# Patient Record
Sex: Female | Born: 1975 | Race: Black or African American | Hispanic: No | Marital: Married | Smoking: Never smoker
Health system: Southern US, Community
[De-identification: ages and names within clinical notes are randomized; demographics above are authoritative.]

## PROBLEM LIST (undated history)

## (undated) DIAGNOSIS — R7303 Prediabetes: Secondary | ICD-10-CM

## (undated) DIAGNOSIS — E079 Disorder of thyroid, unspecified: Secondary | ICD-10-CM

## (undated) DIAGNOSIS — D649 Anemia, unspecified: Secondary | ICD-10-CM

## (undated) DIAGNOSIS — J302 Other seasonal allergic rhinitis: Secondary | ICD-10-CM

## (undated) DIAGNOSIS — K219 Gastro-esophageal reflux disease without esophagitis: Secondary | ICD-10-CM

---

## 2003-08-25 ENCOUNTER — Other Ambulatory Visit: Payer: Self-pay

## 2003-09-27 ENCOUNTER — Other Ambulatory Visit: Payer: Self-pay

## 2003-11-14 ENCOUNTER — Other Ambulatory Visit: Payer: Self-pay

## 2011-04-15 DIAGNOSIS — Z0000 Encounter for general adult medical examination without abnormal findings: Secondary | ICD-10-CM | POA: Insufficient documentation

## 2013-04-20 DIAGNOSIS — D509 Iron deficiency anemia, unspecified: Secondary | ICD-10-CM | POA: Insufficient documentation

## 2013-04-20 DIAGNOSIS — E038 Other specified hypothyroidism: Secondary | ICD-10-CM | POA: Insufficient documentation

## 2013-06-22 ENCOUNTER — Ambulatory Visit: Payer: Self-pay | Admitting: Physician Assistant

## 2013-06-25 LAB — BETA STREP CULTURE(ARMC)

## 2013-06-26 DIAGNOSIS — R490 Dysphonia: Secondary | ICD-10-CM | POA: Insufficient documentation

## 2013-10-29 DIAGNOSIS — L608 Other nail disorders: Secondary | ICD-10-CM | POA: Insufficient documentation

## 2013-10-29 DIAGNOSIS — E010 Iodine-deficiency related diffuse (endemic) goiter: Secondary | ICD-10-CM | POA: Insufficient documentation

## 2017-11-16 DIAGNOSIS — L301 Dyshidrosis [pompholyx]: Secondary | ICD-10-CM | POA: Insufficient documentation

## 2017-11-16 DIAGNOSIS — R739 Hyperglycemia, unspecified: Secondary | ICD-10-CM | POA: Insufficient documentation

## 2017-11-16 DIAGNOSIS — E7800 Pure hypercholesterolemia, unspecified: Secondary | ICD-10-CM | POA: Insufficient documentation

## 2017-11-16 DIAGNOSIS — J301 Allergic rhinitis due to pollen: Secondary | ICD-10-CM | POA: Insufficient documentation

## 2018-02-28 ENCOUNTER — Other Ambulatory Visit: Payer: Self-pay

## 2018-02-28 ENCOUNTER — Ambulatory Visit
Admission: EM | Admit: 2018-02-28 | Discharge: 2018-02-28 | Disposition: A | Discharge status: Home / Self Care | Payer: Managed Care, Other (non HMO) | Attending: Internal Medicine | Admitting: Internal Medicine

## 2018-02-28 ENCOUNTER — Encounter: Payer: Self-pay | Admitting: Emergency Medicine

## 2018-02-28 DIAGNOSIS — M542 Cervicalgia: Secondary | ICD-10-CM | POA: Diagnosis not present

## 2018-02-28 DIAGNOSIS — R590 Localized enlarged lymph nodes: Secondary | ICD-10-CM | POA: Diagnosis not present

## 2018-02-28 DIAGNOSIS — H9201 Otalgia, right ear: Secondary | ICD-10-CM

## 2018-02-28 HISTORY — DX: Other seasonal allergic rhinitis: J30.2

## 2018-02-28 HISTORY — DX: Anemia, unspecified: D64.9

## 2018-02-28 HISTORY — DX: Gastro-esophageal reflux disease without esophagitis: K21.9

## 2018-02-28 HISTORY — DX: Disorder of thyroid, unspecified: E07.9

## 2018-02-28 HISTORY — DX: Prediabetes: R73.03

## 2018-02-28 MED ORDER — SULFAMETHOXAZOLE-TRIMETHOPRIM 800-160 MG PO TABS: 1.0000 | ORAL_TABLET | Freq: Two times a day (BID) | ORAL | 0 refills | Status: AC

## 2018-02-28 NOTE — ED Provider Notes (Signed)
[facility] ____________________________________________  Time seen: Approximately 3:51 PM  I have reviewed the triage vital signs and the nursing notes.   HISTORY  Chief Complaint Adenopathy   HPI Caitlin Hammond is a 42 y.o. female presenting for evaluation of right-sided intermittent ear pain, neck pain present for the last 3 days.  States today she occasionally had pain that would radiate from her neck up to her ear which was sharp, otherwise reports a dull aching pain that is not severe.  Denies any typical sore throat discomfort, painful swallowing, cough, congestion or fevers.  Has continued to eat and drink well.  Denies any oral swelling sensation.  Denies trauma.  Denies any insect sting or rash.  Denies dental tenderness.  Denies history of the same.  States that she feels like there is a lymph node that is swollen in her right neck and that is tender when palpated.  Has not been taking any medications over-the-counter on a regular basis for the same complaint.  Denies any other aggravating or alleviating factors.  Denies trigger factors.  Reports otherwise feels well. Denies recent sickness. Denies recent antibiotic use.   [place]: PCP Patient's last menstrual period was 02/23/2018.  Denies pregnancy   Past Medical History:  Diagnosis Date  . Anemia   . GERD (gastroesophageal reflux disease)   . Prediabetes   . Seasonal allergies   . Thyroid disease     There are no active problems to display for this patient.   History reviewed. No pertinent surgical history.   No current facility-administered medications for this encounter.   Current Outpatient Medications:  .  ferrous sulfate 325 (65 FE) MG tablet, Take 1 tablet by mouth daily., Disp: , Rfl:  .  fluticasone (FLONASE) 50 MCG/ACT nasal spray, Place 2 sprays into the nose daily., Disp: , Rfl:  .  levothyroxine (SYNTHROID, LEVOTHROID) 100 MCG tablet, Take 1 tablet by mouth daily.,  Disp: , Rfl:  .  omeprazole (PRILOSEC) 20 MG capsule, TAKE 1 CAPSULE(20 MG) BY MOUTH EVERY DAY, Disp: , Rfl:  .  sulfamethoxazole-trimethoprim (BACTRIM DS,SEPTRA DS) 800-160 MG tablet, Take 1 tablet by mouth 2 (two) times daily for 7 days., Disp: 14 tablet, Rfl: 0  Allergies Penicillins  Family History  Problem Relation Age of Onset  . Pancreatitis Mother 4331  . Diabetes Father   . Congestive Heart Failure Father   . Hypertension Father     Social History Social History   Tobacco Use  . Smoking status: Never Smoker  . Smokeless tobacco: Never Used  Substance Use Topics  . Alcohol use: Never    Frequency: Never  . Drug use: Never    Review of Systems Constitutional: No fever/chills ENT: No sore throat. Cardiovascular: Denies chest pain. Respiratory: Denies shortness of breath. Gastrointestinal: No abdominal pain.  No nausea, no vomiting. Musculoskeletal: Negative for back pain. Skin: Negative for rash.  ____________________________________________   PHYSICAL EXAM:  VITAL SIGNS: ED Triage Vitals [02/28/18 1429]  Enc Vitals Group     BP (!) 149/83     Pulse Rate 91     Resp 16     Temp 98 F (36.7 C)     Temp Source Oral     SpO2 100 %     Weight (!) 304 lb (137.9 kg)     Height 5\' 2"  (1.575 m)     Head Circumference      Peak Flow      Pain Score  6     Pain Loc      Pain Edu?      Excl. in GC?    Constitutional: Alert and oriented. Well appearing and in no acute distress. Eyes: Conjunctivae are normal.  Head: Atraumatic. No sinus tenderness to palpation. No swelling. No erythema.  No TMJ tenderness.  Ears: nontender, normal canals,no erythema, normal TMs bilaterally.  No mastoid tenderness bilaterally.  Nose:No nasal congestion  Mouth/Throat: Mucous membranes are moist. No pharyngeal erythema. No exudate.  3+ tonsillar swelling.  No oral lesions.  Dental fractures to right upper and lower premolars, no erythema, nontender, no gumline swelling. Neck: No  stridor.  No cervical spine tenderness to palpation.  No thyromegaly palpated.  No visible edema.  No erythema. Hematological/Lymphatic/Immunilogical: Right anterior mild cervical lymph node swelling submandibular, no other cervical lymphadenopathy palpated. Cardiovascular: Normal rate, regular rhythm. Grossly normal heart sounds.  Good peripheral circulation. Respiratory: Normal respiratory effort.  No retractions. No wheezes, rales or rhonchi. Good air movement.  Musculoskeletal: Ambulatory with steady gait.  Neurologic:  Normal speech and language. No gait instability. Skin:  Skin appears warm, dry and intact. No rash noted. Psychiatric: Mood and affect are normal. Speech and behavior are normal.  ___________________________________________   LABS (all labs ordered are listed, but only abnormal results are displayed)  Labs Reviewed - No data to display  PROCEDURES Procedures   INITIAL IMPRESSION / ASSESSMENT AND PLAN / ED COURSE  Pertinent labs & imaging results that were available during my care of the patient were reviewed by me and considered in my medical decision making (see chart for details).  Well-appearing patient.  No acute distress.  Presented for evaluation of right-sided tender lymphadenopathy.  Patient does have chronic baseline tonsillar swelling, patient denies any acute swelling or sore throat.  No other lymphadenopathy noted.  Discussed multiple differentials with patient including cellulitis, pharyngitis, dental infection, TMJ, viral illness, sialoadenitis, right otitis.  Exam well-appearing except for tender right cervical lymph node.  Patient anaphylactic allergic to penicillin.  Discussed supportive care alone versus initiation of antibiotic therapy and supportive care, patient requesting about therapy, will treat with oral Bactrim.  Declined strep evaluation.  Discussed very strict follow-up and return parameters, over-the-counter use of ibuprofen and close  monitoring.Discussed indication, risks and benefits of medications with patient.  Discussed follow up with Primary care physician this week. Discussed follow up and return parameters including no resolution or any worsening concerns. Patient verbalized understanding and agreed to plan.   ____________________________________________   FINAL CLINICAL IMPRESSION(S) / ED DIAGNOSES  Final diagnoses:  Lymphadenopathy of right cervical region     ED Discharge Orders         Ordered    sulfamethoxazole-trimethoprim (BACTRIM DS,SEPTRA DS) 800-160 MG tablet  2 times daily     02/28/18 1521           Note: This dictation was prepared with Dragon dictation along with smaller phrase technology. Any transcriptional errors that result from this process are unintentional.         Renford DillsMiller, Reyden Smith, NP 02/28/18 1637

## 2018-02-28 NOTE — ED Triage Notes (Signed)
Patient in today c/o right sided lymph node swelling and pain, right ear pain and right side of face pain x 3 days, worsening today. Patient denies fever.

## 2018-02-28 NOTE — Discharge Instructions (Addendum)
Take medication as prescribed. Rest. Drink plenty of fluids.  ° °Follow up with your primary care physician this week as needed. Return to Urgent care for new or worsening concerns.  ° °

## 2018-04-12 ENCOUNTER — Other Ambulatory Visit: Payer: Self-pay

## 2018-04-12 ENCOUNTER — Emergency Department
Admission: EM | Admit: 2018-04-12 | Discharge: 2018-04-12 | Disposition: A | Discharge status: Home / Self Care | Payer: 59 | Attending: Emergency Medicine | Admitting: Emergency Medicine

## 2018-04-12 ENCOUNTER — Emergency Department: Payer: 59

## 2018-04-12 ENCOUNTER — Encounter: Payer: Self-pay | Admitting: Emergency Medicine

## 2018-04-12 DIAGNOSIS — R0602 Shortness of breath: Secondary | ICD-10-CM

## 2018-04-12 LAB — BASIC METABOLIC PANEL
ANION GAP: 7 (ref 5–15)
BUN: 11 mg/dL (ref 6–20)
CO2: 23 mmol/L (ref 22–32)
Calcium: 9.1 mg/dL (ref 8.9–10.3)
Chloride: 106 mmol/L (ref 98–111)
Creatinine, Ser: 0.63 mg/dL (ref 0.44–1.00)
GFR calc Af Amer: >60 mL/min (ref >60)
GLUCOSE: 100 mg/dL — AB (ref 70–99)
POTASSIUM: 3.9 mmol/L (ref 3.5–5.1)
SODIUM: 136 mmol/L (ref 135–145)

## 2018-04-12 LAB — CBC WITH DIFFERENTIAL/PLATELET
BASOS ABS: 0 10*3/uL (ref 0–0.1)
Basophils Relative: 1 %
Eosinophils Absolute: 0.2 10*3/uL (ref 0–0.7)
Eosinophils Relative: 3 %
HEMATOCRIT: 33.8 % — AB (ref 35.0–47.0)
Hemoglobin: 11.6 g/dL — ABNORMAL LOW (ref 12.0–16.0)
LYMPHS PCT: 25 %
Lymphs Abs: 1.7 10*3/uL (ref 1.0–3.6)
MCH: 28 pg (ref 26.0–34.0)
MCHC: 34.3 g/dL (ref 32.0–36.0)
MCV: 81.9 fL (ref 80.0–100.0)
Monocytes Absolute: 0.5 10*3/uL (ref 0.2–0.9)
Monocytes Relative: 8 %
NEUTROS ABS: 4.3 10*3/uL (ref 1.4–6.5)
Neutrophils Relative %: 63 %
Platelets: 486 10*3/uL — ABNORMAL HIGH (ref 150–440)
RBC: 4.12 MIL/uL (ref 3.80–5.20)
RDW: 16.1 % — ABNORMAL HIGH (ref 11.5–14.5)
WBC: 6.7 10*3/uL (ref 3.6–11.0)

## 2018-04-12 LAB — TROPONIN I: Troponin I: <0.03 ng/mL (ref <0.03)

## 2018-04-12 LAB — FIBRIN DERIVATIVES D-DIMER (ARMC ONLY): Fibrin derivatives D-dimer (ARMC): 294.13 ng/mL (FEU) (ref 0.00–499.00)

## 2018-04-12 NOTE — ED Notes (Signed)
Report to Stephen RN 

## 2018-04-12 NOTE — Discharge Instructions (Addendum)
Your test today, including labs, EKG, and chest x-ray are all unremarkable.  There are no signs of heart or lung disease at this time to explain your symptoms.  Please follow-up with your doctor for continued evaluation.  Return to the ED if your symptoms worsen or if you have any new concerns.

## 2018-04-12 NOTE — ED Triage Notes (Addendum)
Pt arrived via EMS from home with reports of shortness of breath that started last night, pt reports the SOB improved and then as she was getting ready this morning the shortness of breath started again.  Pt is able to speak in complete sentences without running out of breath but states she tires out when exerting herself.  Pt denies any pain. Pt has hx of IDA but is not taking iron supplements.

## 2018-04-12 NOTE — ED Provider Notes (Signed)
[facility] Emergency Department Provider Note  ____________________________________________  Time seen: Approximately 12:44 PM  I have reviewed the triage vital signs and the nursing notes.   HISTORY  Chief Complaint Shortness of Breath    HPI Caitlin Hammond is a 42 y.o. female with a history of iron deficiency anemia, GERD, seasonal allergies who complains of shortness of breath since last night.  No aggravating or alleviating factors.  Not exertional.  No chest pain whatsoever, not pleuritic.  No cough.  Not positional.  Denies fever chills or sweats.  No weight changes.  Denies ever having anything like this before.  It is intermittent lasting a few minutes at a time but today seems to be more continuous.  She denies nasal congestion or sinus pressure.  She does have a history of seasonal allergies though.        Past Medical History:  Diagnosis Date  . Anemia   . GERD (gastroesophageal reflux disease)   . Prediabetes   . Seasonal allergies   . Thyroid disease      There are no active problems to display for this patient.    History reviewed. No pertinent surgical history.   Prior to Admission medications   Medication Sig Start Date End Date Taking? Authorizing Provider  omeprazole (PRILOSEC) 20 MG capsule Take 20 mg by mouth daily as needed (for acid reflux).    Yes [provider]     Allergies Penicillins   Family History  Problem Relation Age of Onset  . Pancreatitis Mother 16  . Diabetes Father   . Congestive Heart Failure Father   . Hypertension Father     Social History Social History   Tobacco Use  . Smoking status: Never Smoker  . Smokeless tobacco: Never Used  Substance Use Topics  . Alcohol use: Never    Frequency: Never  . Drug use: Never    Review of Systems  Constitutional:   No fever or chills.  ENT:   No sore throat. No rhinorrhea. Cardiovascular:   No chest pain or syncope. Respiratory:  Positive as above for shortness of breath without cough. Gastrointestinal:   Negative for abdominal pain, vomiting and diarrhea.  Musculoskeletal:   Negative for focal pain or swelling All other systems reviewed and are negative except as documented above in ROS and HPI.  ____________________________________________   PHYSICAL EXAM:  VITAL SIGNS: ED Triage Vitals  Enc Vitals Group     BP 04/12/18 0749 (!) 144/72     Pulse Rate 04/12/18 0749 91     Resp 04/12/18 0749 20     Temp 04/12/18 0749 97.8 F (36.6 C)     Temp Source 04/12/18 0749 Oral     SpO2 04/12/18 0745 100 %     Weight 04/12/18 0747 (!) 304 lb 0.2 oz (137.9 kg)     Height 04/12/18 0747 5\' 2"  (1.575 m)     Head Circumference --      Peak Flow --      Pain Score 04/12/18 0749 0     Pain Loc --      Pain Edu? --      Excl. in GC? --     Vital signs reviewed, nursing assessments reviewed.   Constitutional:   Alert and oriented. Non-toxic appearance. Eyes:   Conjunctivae are normal. EOMI. PERRL. ENT      Head:   Normocephalic and atraumatic.      Nose:   No congestion/rhinnorhea.  Mouth/Throat:   MMM, no pharyngeal erythema. No peritonsillar mass.       Neck:   No meningismus. Full ROM. Hematological/Lymphatic/Immunilogical:   No cervical lymphadenopathy. Cardiovascular:   RRR. Symmetric bilateral radial and DP pulses.  No murmurs. Cap refill less than 2 seconds. Respiratory:   Normal respiratory effort without tachypnea/retractions. Breath sounds are clear and equal bilaterally. No wheezes/rales/rhonchi.  No inducible wheezing or cough with FEV1 maneuver. Gastrointestinal:   Soft and nontender. Non distended. There is no CVA tenderness.  No rebound, rigidity, or guarding. Genitourinary:   deferred Musculoskeletal:   Normal range of motion in all extremities. No joint effusions.  No lower extremity tenderness.  No edema. Neurologic:   Normal speech and language.  Motor grossly intact. No acute focal  neurologic deficits are appreciated.  Skin:    Skin is warm, dry and intact. No rash noted.  No petechiae, purpura, or bullae.  ____________________________________________    LABS (pertinent positives/negatives) (all labs ordered are listed, but only abnormal results are displayed) Labs Reviewed  BASIC METABOLIC PANEL - Abnormal; Notable for the following components:      Result Value   Glucose, Bld 100 (*)    All other components within normal limits  CBC WITH DIFFERENTIAL/PLATELET - Abnormal; Notable for the following components:   Hemoglobin 11.6 (*)    HCT 33.8 (*)    RDW 16.1 (*)    Platelets 486 (*)    All other components within normal limits  TROPONIN I  FIBRIN DERIVATIVES D-DIMER ([facility] ONLY)   ____________________________________________   EKG  Interpreted by me Sinus rhythm rate of 80, normal axis intervals QRS ST segments and T waves.  ____________________________________________    RADIOLOGY  Dg Chest 2 View  Result Date: 04/12/2018 CLINICAL DATA:  Shortness of breath for 2 days EXAM: CHEST - 2 VIEW COMPARISON:  None. FINDINGS: Cardiac shadows within normal limits. The lungs are well aerated bilaterally. No focal infiltrate or sizable effusion is seen. No bony abnormality is noted. IMPRESSION: No active cardiopulmonary disease. Electronically Signed   By: Alcide Clever M.D.   On: 04/12/2018 08:17    ____________________________________________   PROCEDURES Procedures  ____________________________________________    CLINICAL IMPRESSION / ASSESSMENT AND PLAN / ED COURSE  Pertinent labs & imaging results that were available during my care of the patient were reviewed by me and considered in my medical decision making (see chart for details).    Patient presents with shortness of breath that started last night, intermittent.  Atypical in nature.  Low suspicion for ACS PE dissection AAA pneumothorax pericarditis pneumonia or sepsis.  Labs are unremarkable  without evidence of acidosis.  Troponin and d-dimer negative.  No further work-up indicated at this time, suitable for discharge home and outpatient follow-up with primary care.  Return precautions were discussed.    She does report significant stress related to undergoing an adoption process for the second time which may be related to her current symptoms.  Her history of seasonal allergies may play a role as well with current seasonal changes resulting in lots of dead leaf debris.      ____________________________________________   FINAL CLINICAL IMPRESSION(S) / ED DIAGNOSES    Final diagnoses:  Shortness of breath     ED Discharge Orders    None      Portions of this note were generated with dragon dictation software. Dictation errors may occur despite best attempts at proofreading.    Sharman Cheek, MD 04/12/18 1248

## 2019-03-27 IMAGING — CR DG CHEST 2V
1 series · 2 of 2 positions shown · non-contrast
Comparison: None.

CLINICAL DATA: Shortness of breath for 2 days

EXAM:
CHEST - 2 VIEW

[Series 1: dg chest 2 view · 0.14mm/px · 2 of 2 slices shown]
[im 1/2]
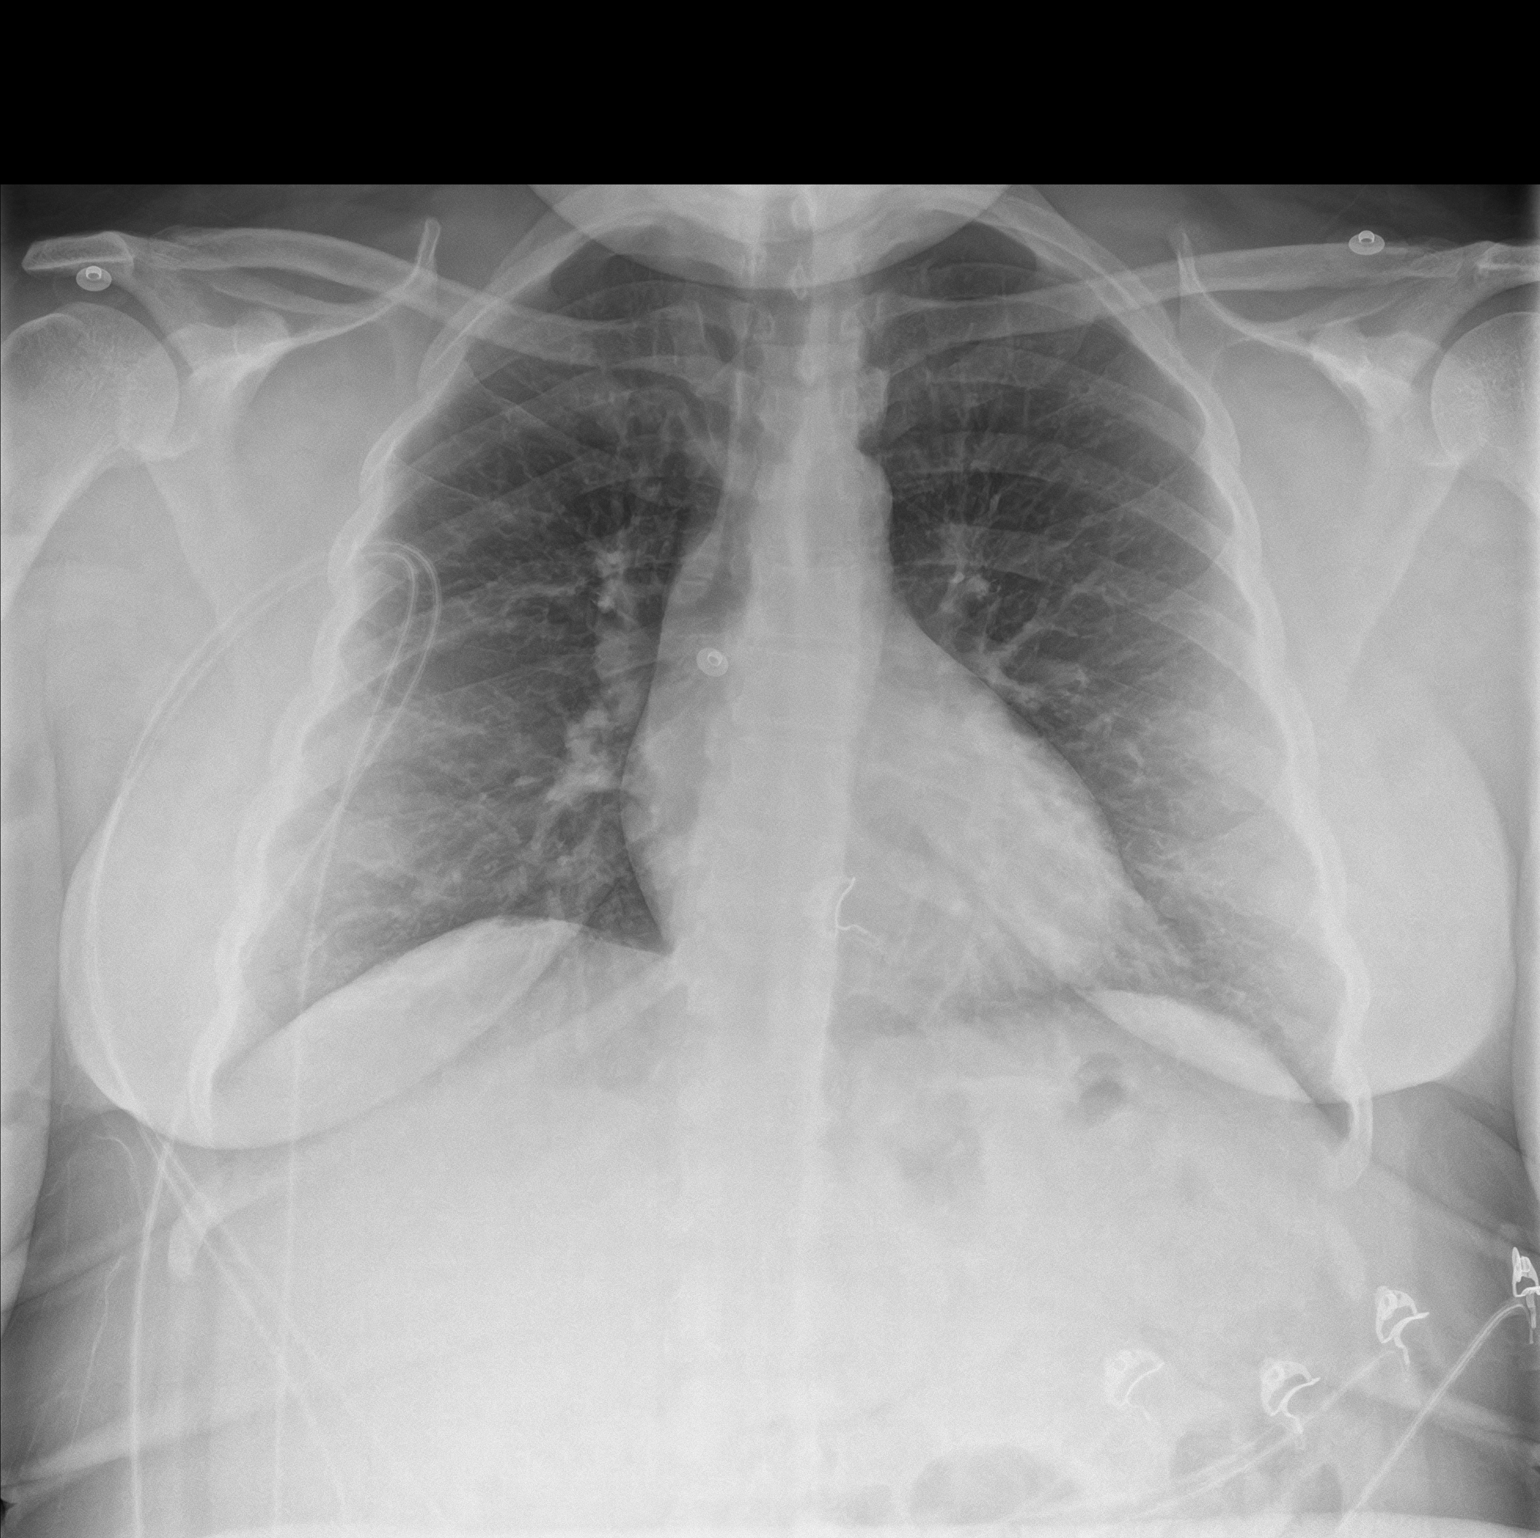
[im 2/2]
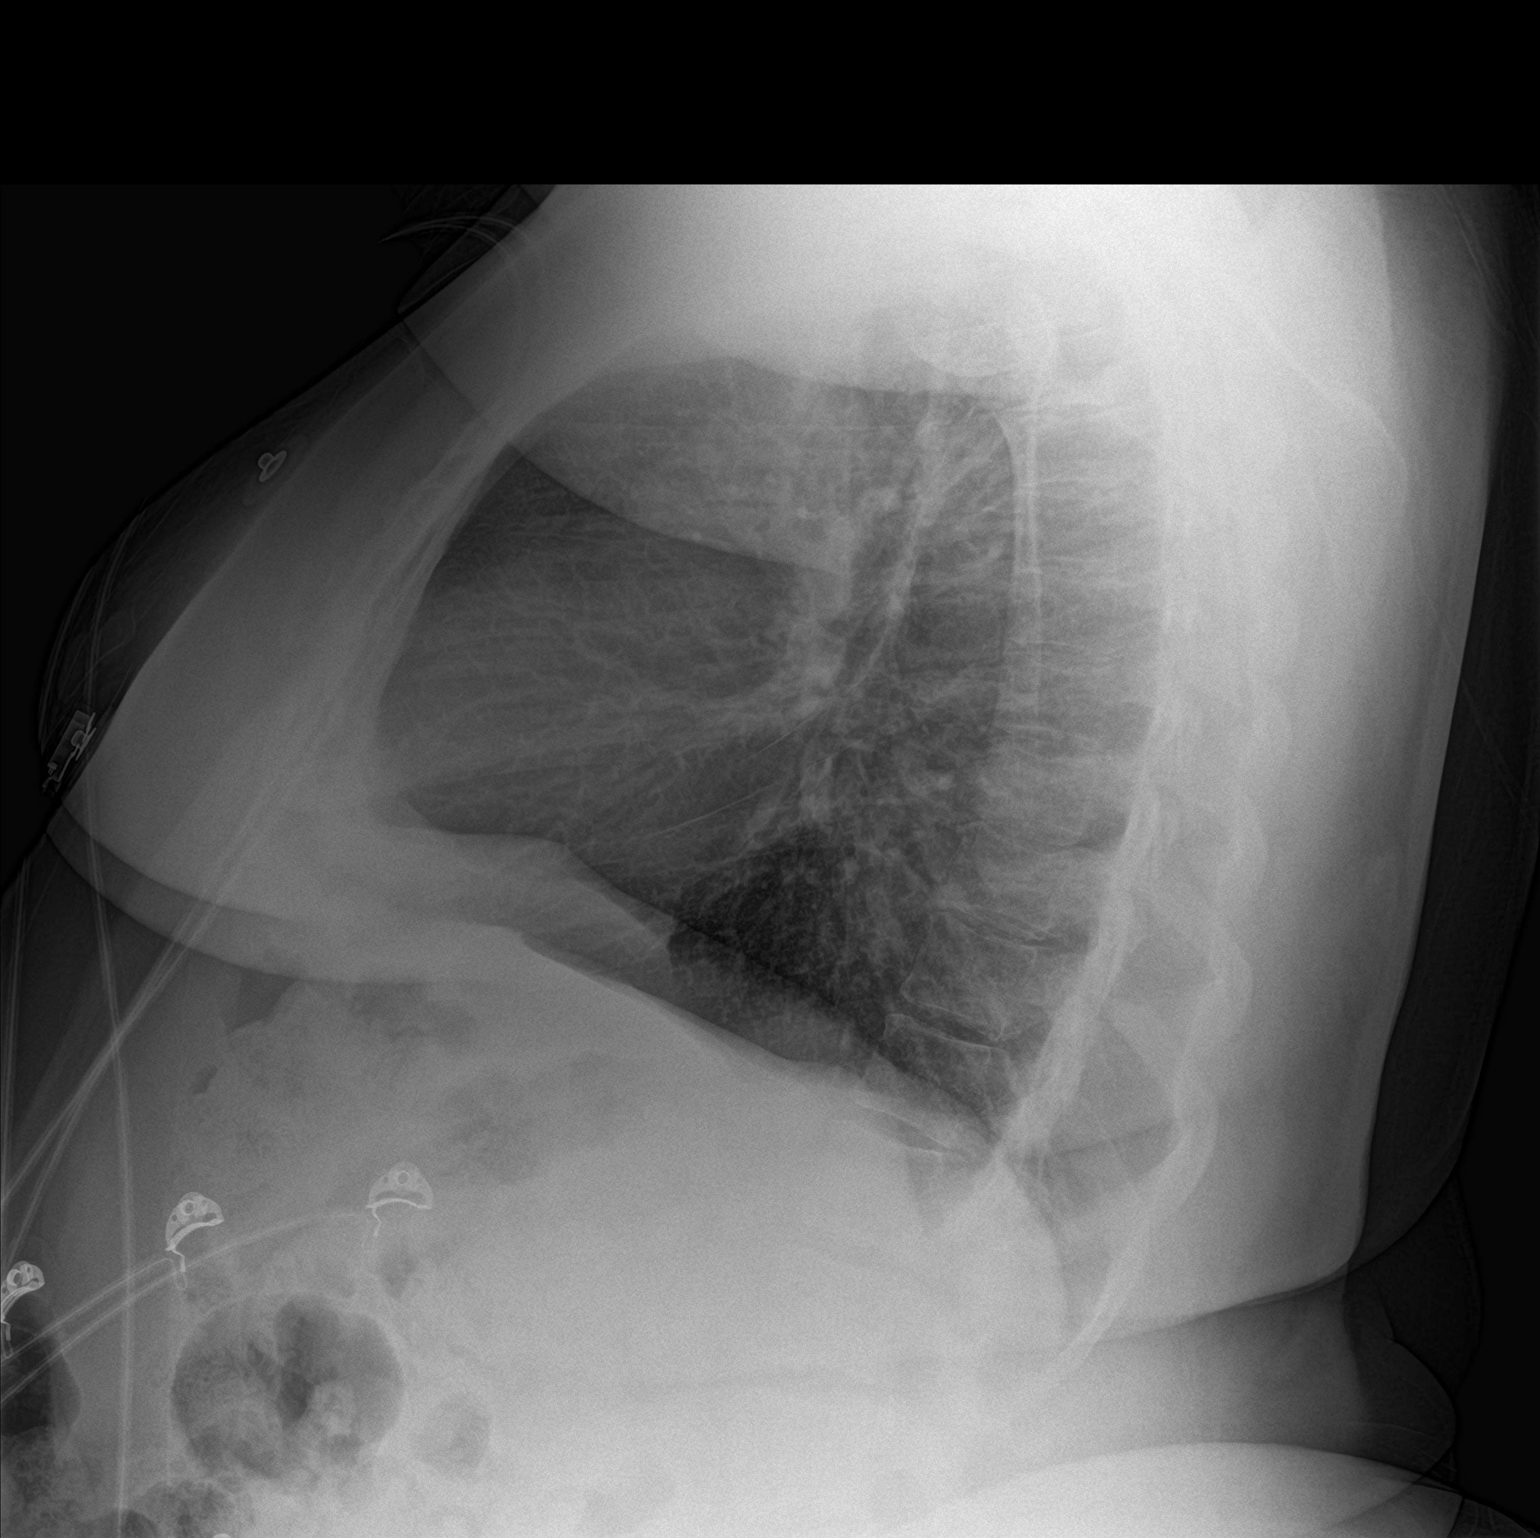

[2 of 2 positions shown; findings below may reference images not displayed]

FINDINGS: Cardiac shadows within normal limits. The lungs are well aerated
bilaterally. No focal infiltrate or sizable effusion is seen. No
bony abnormality is noted.
IMPRESSION: No active cardiopulmonary disease.

## 2020-12-20 DIAGNOSIS — E538 Deficiency of other specified B group vitamins: Secondary | ICD-10-CM | POA: Insufficient documentation

## 2020-12-20 DIAGNOSIS — I10 Essential (primary) hypertension: Secondary | ICD-10-CM | POA: Insufficient documentation

## 2020-12-20 DIAGNOSIS — E559 Vitamin D deficiency, unspecified: Secondary | ICD-10-CM | POA: Insufficient documentation

## 2020-12-20 DIAGNOSIS — E785 Hyperlipidemia, unspecified: Secondary | ICD-10-CM | POA: Insufficient documentation

## 2023-08-29 DIAGNOSIS — M419 Scoliosis, unspecified: Secondary | ICD-10-CM | POA: Insufficient documentation

## 2023-08-29 DIAGNOSIS — M51369 Other intervertebral disc degeneration, lumbar region without mention of lumbar back pain or lower extremity pain: Secondary | ICD-10-CM | POA: Insufficient documentation

## 2023-09-20 ENCOUNTER — Ambulatory Visit
Admission: EM | Admit: 2023-09-20 | Discharge: 2023-09-20 | Disposition: A | Discharge status: Home / Self Care | Attending: Emergency Medicine | Admitting: Emergency Medicine

## 2023-09-20 DIAGNOSIS — M1712 Unilateral primary osteoarthritis, left knee: Secondary | ICD-10-CM | POA: Diagnosis not present

## 2023-09-20 DIAGNOSIS — G43909 Migraine, unspecified, not intractable, without status migrainosus: Secondary | ICD-10-CM | POA: Insufficient documentation

## 2023-09-20 DIAGNOSIS — M25562 Pain in left knee: Secondary | ICD-10-CM | POA: Diagnosis not present

## 2023-09-20 DIAGNOSIS — E669 Obesity, unspecified: Secondary | ICD-10-CM | POA: Insufficient documentation

## 2023-09-20 DIAGNOSIS — L83 Acanthosis nigricans: Secondary | ICD-10-CM | POA: Insufficient documentation

## 2023-09-20 MED ORDER — IBUPROFEN 600 MG PO TABS: 600.0000 mg | ORAL_TABLET | Freq: Four times a day (QID) | ORAL | 0 refills | Status: AC | PRN

## 2023-09-20 NOTE — Discharge Instructions (Signed)
 Try the Ace wrap, and if this does not provide enough support, you can get a neoprene knee sleeve off of [employer].  Any brand should do.  Ice at the end of the day, take 600 mg of ibuprofen with 1000 mg of Tylenol 3-4 times a day as needed for pain.  Follow-up with Dr. Cyleigh Massaro Royalty, sports medicine, if no better in a week to 10 days with conservative therapy.  I have put in a referral to him.

## 2023-09-20 NOTE — ED Triage Notes (Signed)
 Pt c/o L knee pain x2 days. Denies any falls or injuries. Has tried gabapentin w/o relief.

## 2023-09-20 NOTE — ED Provider Notes (Signed)
 HPI  SUBJECTIVE:  Caitlin Hammond is a 48 y.o. female who presents with atraumatic, dull, nonradiating, nonmigratory anterior left knee pain that is intermittent, lasting minutes starting after doing a lot of walking, especially up and down ramps this weekend.  She is normally very sedentary.  No fevers, erythema, increased temperature, limitation of motion, popping, clicking, giving way.  She tried Tylenol with some improvement in her symptoms.  Symptoms are worse with standing, weightbearing.  It is not associated with flexion/extension.  She has never had symptoms like this before. She has a past medical history of GERD, hypertension, hypercholesterolemia, hypothyroidism, scoliosis.  No history of gout, diabetes.  LMP: 2/22.  Denies possibility being pregnant.  PCP: [facility] [place].  Past Medical History:  Diagnosis Date   Anemia    GERD (gastroesophageal reflux disease)    Prediabetes    Seasonal allergies    Thyroid disease     History reviewed. No pertinent surgical history.  Family History  Problem Relation Age of Onset   Pancreatitis Mother 55   Diabetes Father    Congestive Heart Failure Father    Hypertension Father     Social History   Tobacco Use   Smoking status: Never   Smokeless tobacco: Never  Vaping Use   Vaping status: Never Used  Substance Use Topics   Alcohol use: Never   Drug use: Never    No current facility-administered medications for this encounter.  Current Outpatient Medications:    albuterol (VENTOLIN HFA) 108 (90 Base) MCG/ACT inhaler, Inhale 2 inhalations into the lungs every 6 (six) hours as needed for Wheezing for up to 30 days, Disp: , Rfl:    atorvastatin (LIPITOR) 40 MG tablet, Take 1 tablet by mouth at bedtime., Disp: , Rfl:    ferrous sulfate 325 (65 FE) MG EC tablet, Take 1 tablet by mouth daily with breakfast., Disp: , Rfl:    fluticasone (FLONASE) 50 MCG/ACT nasal spray, Place 2 sprays into both nostrils once daily, Disp: ,  Rfl:    gabapentin (NEURONTIN) 100 MG capsule, Take 2 capsules (200 mg total) by mouth 2 (two) times daily, Disp: , Rfl:    ibuprofen (ADVIL) 600 MG tablet, Take 1 tablet (600 mg total) by mouth every 6 (six) hours as needed., Disp: 30 tablet, Rfl: 0   levothyroxine (SYNTHROID) 75 MCG tablet, Take 1 tablet (75 mcg total) by mouth once daily, Disp: , Rfl:    montelukast (SINGULAIR) 10 MG tablet, Take 1 tablet by mouth at bedtime., Disp: , Rfl:    olmesartan (BENICAR) 40 MG tablet, Take 1 tablet (40 mg total) by mouth once daily, Disp: , Rfl:    omeprazole (PRILOSEC) 20 MG capsule, Take 20 mg by mouth daily as needed (for acid reflux). , Disp: , Rfl:   Allergies  Allergen Reactions   Penicillins Anaphylaxis, Shortness Of Breath and Swelling    Has patient had a PCN reaction causing immediate rash, facial/tongue/throat swelling, SOB or lightheadedness with hypotension: Yes Has patient had a PCN reaction causing severe rash involving mucus membranes or skin necrosis: No Has patient had a PCN reaction that required hospitalization: Unknown Has patient had a PCN reaction occurring within the last 10 years: No If all of the above answers are "NO", then may proceed with Cephalosporin use.     ROS  As noted in HPI.   Physical Exam  BP 139/68 (BP Location: Left Arm)   Pulse 97   Temp 98.5 F (36.9 C) (  Oral)   Resp 16   Ht 5\' 2"  (1.575 m)   Wt 134.7 kg   LMP 09/04/2023 (Approximate)   SpO2 98%   BMI 54.32 kg/m   Constitutional: Well developed, well nourished, no acute distress Eyes:  EOMI, conjunctiva normal bilaterally HENT: Normocephalic, atraumatic,mucus membranes moist Respiratory: Normal inspiratory effort Cardiovascular: Normal rate GI: nondistended skin: No rash, skin intact Musculoskeletal:  L Knee: Normal appearance.  ROM baseline for Pt, Flexion  intact , Patella NT, Patellar tendon NT, Medial joint NT, Lateral joint NT, Popliteal region NT, Varus MCL stress testing stable,  Valgus LCL stress testing stable, ACL/PCL stable, McMurray negative,  Distal NVI with intact baseline sensation / motor / pulse distal to knee.  No appreciable effusion. No erythema. No increased temperature. No crepitus.  Mild pain along IT band with external rotation of the hip, no other pain with hip range of motion.  No quadriceps tenderness or tenderness at the insertion of the IT band. Neurologic: Alert & oriented x 3, no focal neuro deficits Psychiatric: Speech and behavior appropriate   ED Course   Medications - No data to display  Orders Placed This Encounter  Procedures   AMB referral to sports medicine    Referral Priority:   Routine    Referral Type:   Consultation    Referral Reason:   Specialty Services Required    Referred to Provider:   Jerrol Banana, MD    Number of Visits Requested:   1    No results found for this or any previous visit (from the past 24 hours). No results found.  ED Clinical Impression  1. Arthritis of left knee   2. Acute pain of left knee      ED Assessment/Plan     Discussed getting x-ray with patient, however, I believe it would be low yield in the absence of trauma, inability to bear weight.  I suspect that she has arthritis that has flared due to the increased activity over the weekend.  Placing in an Ace wrap, she can try a knee sleeve off of [employer], home with Tylenol/ibuprofen, ice at the end of the day, and will refer to Dr. [name], sports medicine if no better in a week with conservative therapy.  Discussed MDM, treatment plan, and plan for follow-up with patient. patient agrees with plan.   Meds ordered this encounter  Medications   ibuprofen (ADVIL) 600 MG tablet    Sig: Take 1 tablet (600 mg total) by mouth every 6 (six) hours as needed.    Dispense:  30 tablet    Refill:  0      *This clinic note was created using Scientist, clinical (histocompatibility and immunogenetics). Therefore, there may be occasional mistakes despite careful  proofreading.  ?    Domenick Gong, MD 09/20/23 1055

## 2024-04-17 ENCOUNTER — Ambulatory Visit
Admission: EM | Admit: 2024-04-17 | Discharge: 2024-04-17 | Attending: Physician Assistant | Admitting: Physician Assistant

## 2024-04-17 ENCOUNTER — Encounter: Payer: Self-pay | Admitting: Emergency Medicine

## 2024-04-17 DIAGNOSIS — R000 Tachycardia, unspecified: Secondary | ICD-10-CM | POA: Diagnosis not present

## 2024-04-17 DIAGNOSIS — R0789 Other chest pain: Secondary | ICD-10-CM

## 2024-04-17 NOTE — ED Provider Notes (Signed)
 [facility]    CSN: [id number] Arrival date & time: 04/17/24  1553      History   Chief Complaint Chief Complaint  Patient presents with   Chest Pain    HPI Caitlin Hammond is a 48 y.o. female presenting for left sided chest pain/pressure x 3 hours. She says she became sweaty, fatigued, dizzy and had sharp 7-8/10 left sided chest pain that radiated to left arm and neck at onset. Current pain is 3/10. She is still reporting chest pressure. Denies palpitations, shortness of breath, fatigue and weakness. No fever, cough or congestion. No recent illness.  No history of cardiopulmonary disease. History of obesity, prediabetes, hypertension, and hyperlipidemia.   HPI  Past Medical History:  Diagnosis Date   Anemia    GERD (gastroesophageal reflux disease)    Prediabetes    Seasonal allergies    Thyroid disease     Patient Active Problem List   Diagnosis Date Noted   Acanthosis nigricans 09/20/2023   Migraine 09/20/2023   Obesity 09/20/2023   Degenerative disc disease, lumbar 08/29/2023   Scoliosis of lumbar spine 08/29/2023   B12 deficiency 12/20/2020   Dyslipidemia 12/20/2020   Essential hypertension 12/20/2020   Vitamin D insufficiency 12/20/2020   Dyshidrotic dermatitis 11/16/2017   Hypercholesterolemia 11/16/2017   Hyperglycemia 11/16/2017   Seasonal allergic rhinitis due to pollen 11/16/2017   Melanonychia 10/29/2013   Thyromegaly 10/29/2013   Dysphonia 06/26/2013   Iron deficiency anemia 04/20/2013   Subclinical hypothyroidism 04/20/2013   Routine history and physical examination of adult 04/15/2011    History reviewed. No pertinent surgical history.  OB History   No obstetric history on file.      Home Medications    Prior to Admission medications   Medication Sig Start Date End Date Taking? Authorizing Provider  sucralfate (CARAFATE) 1 g tablet Take 1 g by mouth 4 (four) times daily. 12/04/23  Yes [provider]  Vitamin D,  Ergocalciferol, (DRISDOL) 1.25 MG (50000 UNIT) CAPS capsule Take 50,000 Units by mouth. 04/04/24 07/03/24 Yes [provider]  albuterol (VENTOLIN HFA) 108 (90 Base) MCG/ACT inhaler Inhale 2 inhalations into the lungs every 6 (six) hours as needed for Wheezing for up to 30 days 08/25/23 09/24/23  [provider]  atorvastatin (LIPITOR) 40 MG tablet Take 1 tablet by mouth at bedtime. 08/25/23 08/24/24  [provider]  ferrous sulfate 325 (65 FE) MG EC tablet Take 1 tablet by mouth daily with breakfast. 08/25/23 08/24/24  [provider]  fluticasone (FLONASE) 50 MCG/ACT nasal spray Place 2 sprays into both nostrils once daily 08/25/23 08/24/24  [provider]  gabapentin (NEURONTIN) 100 MG capsule Take 2 capsules (200 mg total) by mouth 2 (two) times daily 08/25/23 08/24/24  [provider]  ibuprofen  (ADVIL ) 600 MG tablet Take 1 tablet (600 mg total) by mouth every 6 (six) hours as needed. 09/20/23   Mortenson, Ashley, MD  levothyroxine (SYNTHROID) 75 MCG tablet Take 1 tablet (75 mcg total) by mouth once daily 08/26/23 08/26/24  [provider]  montelukast (SINGULAIR) 10 MG tablet Take 1 tablet by mouth at bedtime. 08/25/23 08/24/24  [provider]  olmesartan (BENICAR) 40 MG tablet Take 1 tablet (40 mg total) by mouth once daily 08/25/23 08/24/24  [provider]  omeprazole (PRILOSEC) 20 MG capsule Take 20 mg by mouth daily as needed (for acid reflux).     [provider]    Family History Family History  Problem Relation  Age of Onset   Pancreatitis Mother 43   Diabetes Father    Congestive Heart Failure Father    Hypertension Father     Social History Social History   Tobacco Use   Smoking status: Never   Smokeless tobacco: Never  Vaping Use   Vaping status: Never Used  Substance Use Topics   Alcohol use: Never   Drug use: Never     Allergies   Penicillins   Review of Systems Review of Systems   Constitutional:  Positive for diaphoresis and fatigue. Negative for fever.  HENT:  Negative for congestion.   Respiratory:  Positive for chest tightness. Negative for cough and shortness of breath.   Cardiovascular:  Positive for chest pain. Negative for palpitations.  Gastrointestinal:  Negative for abdominal pain, nausea and vomiting.  Musculoskeletal:  Positive for neck pain. Negative for back pain.  Neurological:  Positive for dizziness. Negative for syncope, weakness and numbness.     Physical Exam Triage Vital Signs ED Triage Vitals  Encounter Vitals Group     BP 04/17/24 1614 135/65     Girls Systolic BP Percentile --      Girls Diastolic BP Percentile --      Boys Systolic BP Percentile --      Boys Diastolic BP Percentile --      Pulse Rate 04/17/24 1614 (!) 112     Resp 04/17/24 1614 18     Temp 04/17/24 1614 98.3 F (36.8 C)     Temp Source 04/17/24 1614 Oral     SpO2 04/17/24 1614 100 %     Weight --      Height --      Head Circumference --      Peak Flow --      Pain Score 04/17/24 1612 3     Pain Loc --      Pain Education --      Exclude from Growth Chart --    No data found.  Updated Vital Signs BP 135/65 (BP Location: Left Wrist)   Pulse (!) 110   Temp 98.3 F (36.8 C) (Oral)   Resp 18   LMP 04/17/2024   SpO2 100%     Physical Exam Vitals and nursing note reviewed.  Constitutional:      General: She is not in acute distress.    Appearance: Normal appearance. She is not ill-appearing or toxic-appearing.  HENT:     Head: Normocephalic and atraumatic.     Nose: Nose normal.     Mouth/Throat:     Mouth: Mucous membranes are moist.     Pharynx: Oropharynx is clear.  Eyes:     General: No scleral icterus.       Right eye: No discharge.        Left eye: No discharge.     Conjunctiva/sclera: Conjunctivae normal.  Cardiovascular:     Rate and Rhythm: Regular rhythm. Tachycardia present.     Heart sounds: Normal heart sounds.  Pulmonary:      Effort: Pulmonary effort is normal. No respiratory distress.     Breath sounds: Normal breath sounds.  Chest:     Chest wall: No tenderness.  Abdominal:     Palpations: Abdomen is soft.     Tenderness: There is abdominal tenderness (mild LUQ).  Musculoskeletal:     Cervical back: Neck supple.  Skin:    General: Skin is dry.  Neurological:     General: No focal deficit present.  Mental Status: She is alert. Mental status is at baseline.     Motor: No weakness.     Gait: Gait normal.  Psychiatric:        Mood and Affect: Mood normal.        Behavior: Behavior normal.      UC Treatments / Results  Labs (all labs ordered are listed, but only abnormal results are displayed) Labs Reviewed - No data to display  EKG   Radiology No results found.  Procedures ED EKG  Date/Time: 04/17/2024 5:00 PM  Performed by: Arvis Jolan NOVAK, PA-C Authorized by: Arvis Jolan NOVAK, PA-C   Interpretation:    Interpretation: abnormal   Rate:    ECG rate:  111   ECG rate assessment: tachycardic   Rhythm:    Rhythm: sinus rhythm   Ectopy:    Ectopy: none   QRS:    QRS axis:  Normal   QRS intervals:  Normal   QRS conduction: normal   ST segments:    ST segments:  Normal T waves:    T waves: normal   Other findings:    Other findings: LAE and LVH   Comments:     Sinus tachycardia, LVH, LAE  (including critical care time)  Medications Ordered in UC Medications - No data to display  Initial Impression / Assessment and Plan / UC Course  I have reviewed the triage vital signs and the nursing notes.  Pertinent labs & imaging results that were available during my care of the patient were reviewed by me and considered in my medical decision making (see chart for details).   48 year old female presents for left-sided chest pain for the past few hours.  Symptoms were initially associated with fatigue, dizziness and sweats.  Pain was initially severe but is now very mild and she  reports a 3 out of 10 pain.  Has not taken anything for pain relief.  History of hypertension, hyperlipidemia, obesity and prediabetes.  No history of previous MI or stroke.  Pulse elevated 110 bpm.  Other vitals normal and stable.  Patient overall well-appearing.  No acute distress.  On exam, she has no chest tenderness but has mild left upper quadrant tenderness.  Chest clear.  Heart regular rhythm.  EKG performed today shows sinus tachycardia with LVH and LAE.  Reviewed EKG results with patient.  Explained my concerns that she needs to have serial EKGs and troponins.  Explained that I cannot rule out acute cardiopulmonary disease/ACS with 1 EKG and her symptoms are concerning enough for further workup in the emergency department.  Explained potential life-threatening complications if she does have an underlying cardiac condition and does not go to emergency department.  Patient agrees to go to [facility] ED.  Husband will take her.  Declines EMS transport.   Final Clinical Impressions(s) / UC Diagnoses   Final diagnoses:  Atypical chest pain  Tachycardia     Discharge Instructions      You have been advised to follow up immediately in the emergency department for concerning signs.symptoms. If you declined EMS transport, please have a family member take you directly to the ED at this time. Do not delay. Based on concerns about condition, if you do not follow up in th e ED, you may risk poor outcomes including worsening of condition, delayed treatment and potentially life threatening issues. If you have declined to go to the ED at this time, you should call your PCP immediately to set up a  follow up appointment.  Go to ED for red flag symptoms, including; fevers you cannot reduce with Tylenol/Motrin , severe headaches, vision changes, numbness/weakness in part of the body, lethargy, confusion, intractable vomiting, severe dehydration, chest pain, breathing difficulty, severe persistent abdominal or  pelvic pain, signs of severe infection (increased redness, swelling of an area), feeling faint or passing out, dizziness, etc. You should especially go to the ED for sudden acute worsening of condition if you do not elect to go at this time.      ED Prescriptions   None    PDMP not reviewed this encounter.   Arvis Jolan NOVAK, PA-C 04/17/24 1945

## 2024-04-17 NOTE — Discharge Instructions (Signed)

## 2024-04-17 NOTE — ED Notes (Signed)
 Patient is being discharged from the Urgent Care and sent to the Emergency Department via POV . Per Lyle Host PA, patient is in need of higher level of care due to Chest Pain. Patient is aware and verbalizes understanding of plan of care.  Vitals:   04/17/24 1614 04/17/24 1616  BP: 135/65   Pulse: (!) 112 (!) 110  Resp: 18   Temp: 98.3 F (36.8 C)   SpO2: 100%

## 2024-04-17 NOTE — ED Triage Notes (Signed)
 Pt presents with left side chest pressure, left arm and neck pain. Pt states her symptoms started after eating half a cheeseburger. She vomited once and that's when the pain started.
